# Patient Record
Sex: Female | Born: 2005 | Race: Black or African American | Hispanic: No | Marital: Single | Smoking: Never smoker
Health system: Southern US, Community
[De-identification: ages and names within clinical notes are randomized; demographics above are authoritative.]

---
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
## 2023-07-11 IMAGING — US US PELVIS COMPLETE
1 series · 13 of 25 positions shown · non-contrast
Comparison: None available.

CLINICAL DATA: Initial evaluation for acute pelvic pain.

EXAM:
TRANSABDOMINAL AND TRANSVAGINAL ULTRASOUND OF PELVIS
DOPPLER ULTRASOUND OF OVARIES
TECHNIQUE: Both transabdominal and transvaginal ultrasound examinations of the
pelvis were performed. Transabdominal technique was performed for
global imaging of the pelvis including uterus, ovaries, adnexal
regions, and pelvic cul-de-sac.
It was necessary to proceed with endovaginal exam following the
transabdominal exam to visualize the uterus, endometrium, and
ovaries. Color and duplex Doppler ultrasound was utilized to
evaluate blood flow to the ovaries.

[Series 1: us pelvis (transabdominal only) · 73 acquisitions, 13 frames shown]
[im 1/73]
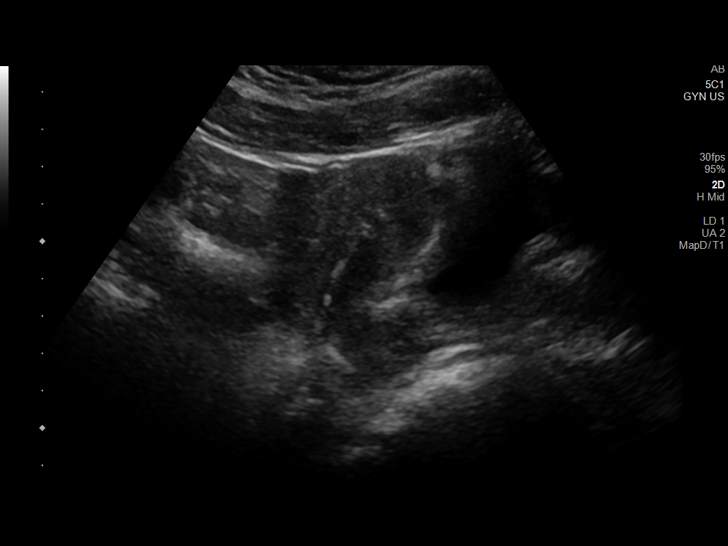
[im 7/73]
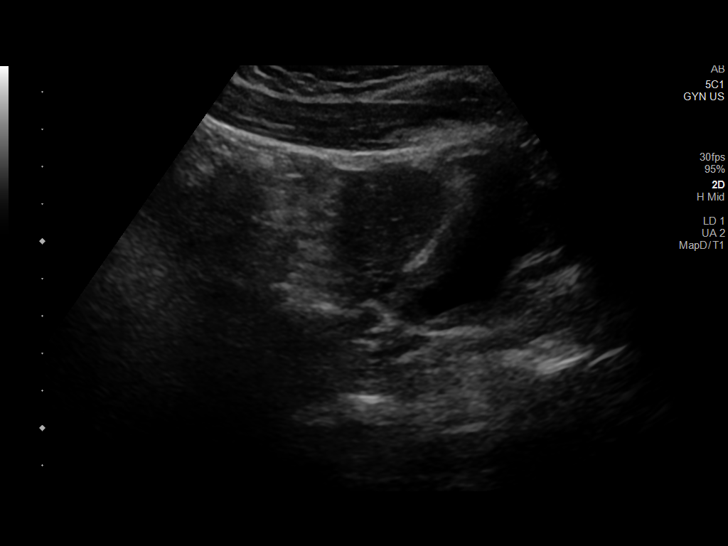
[im 13/73]
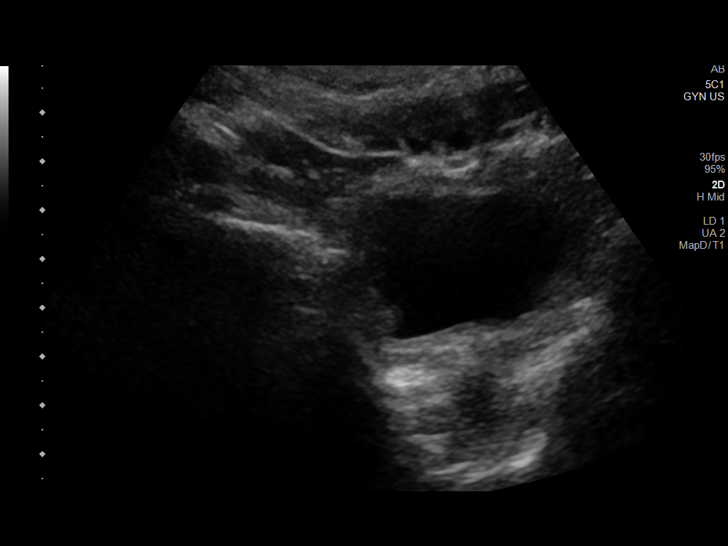
[im 19/73]
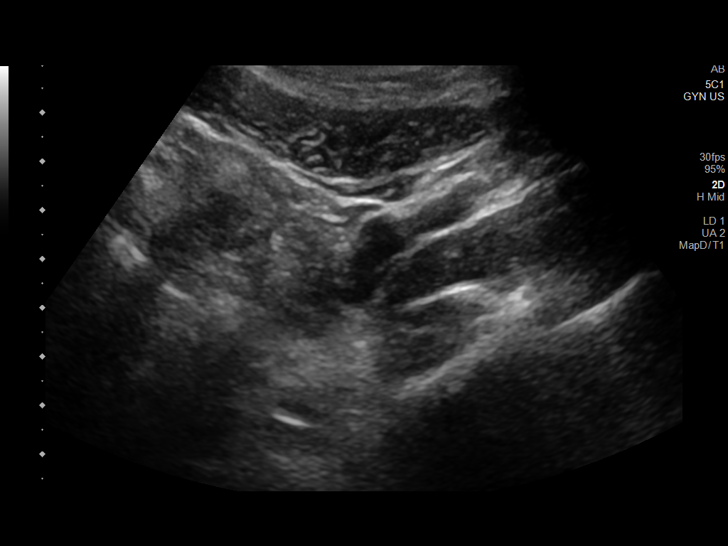
[im 25/73]
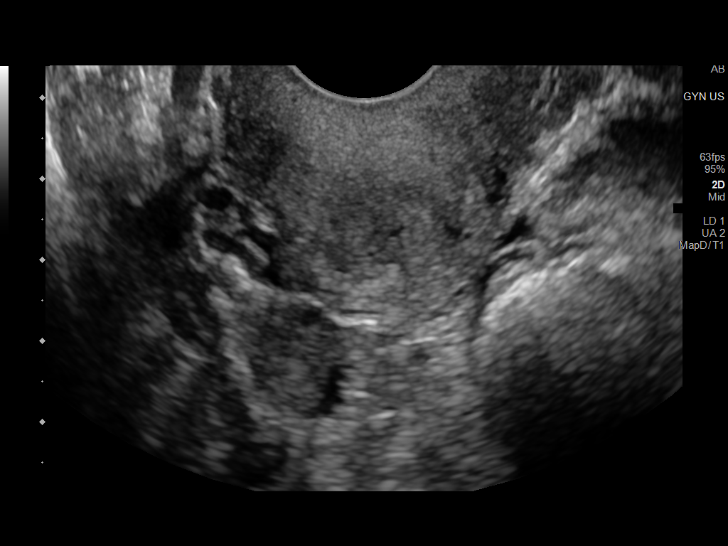
[im 31/73]
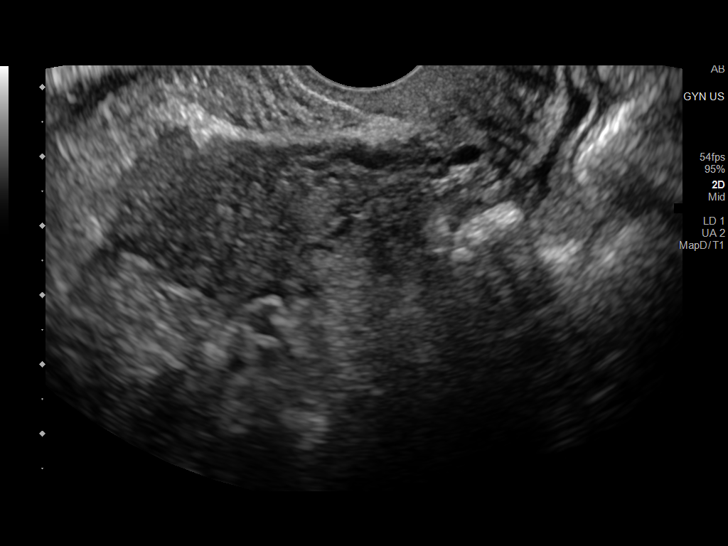
[im 37/73]
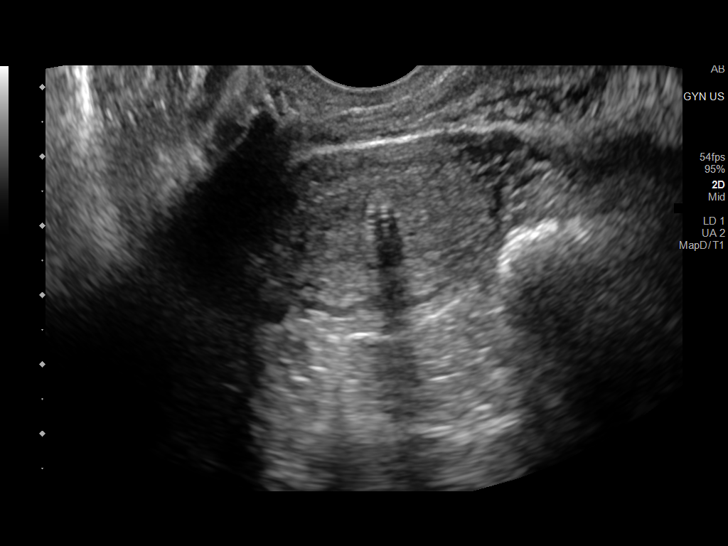
[im 43/73]
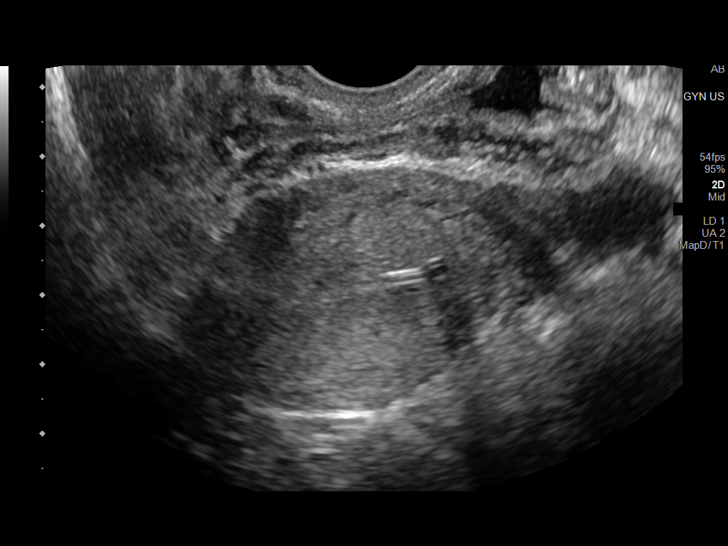
[im 49/73]
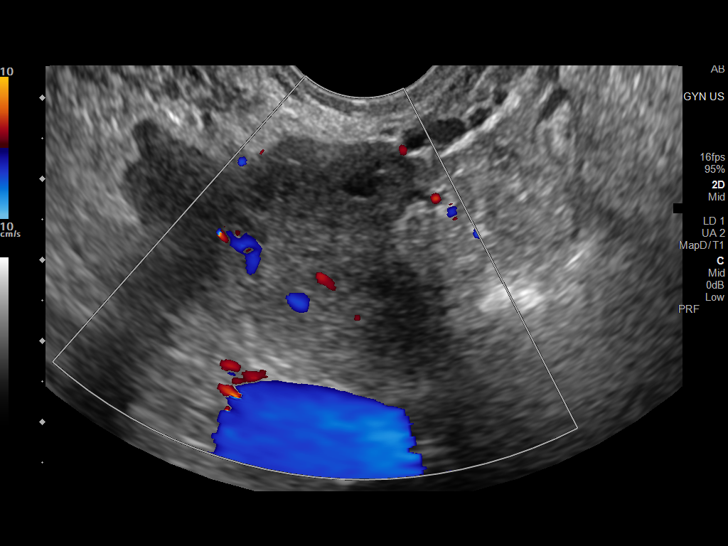
[im 55/73]
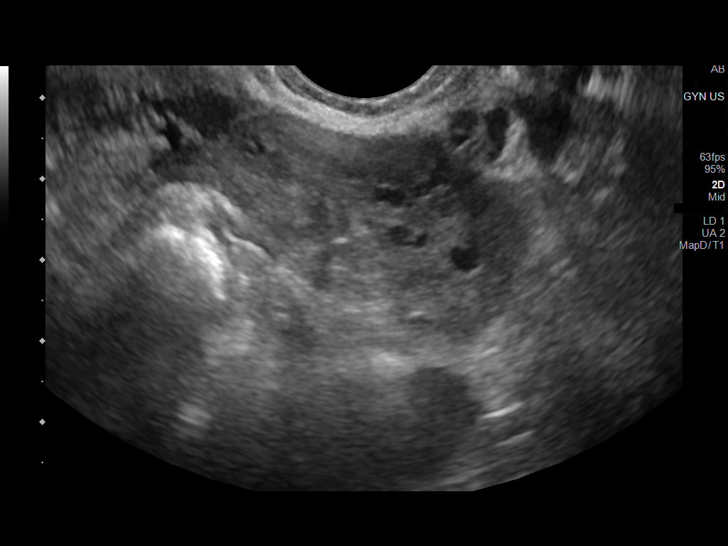
[im 61/73]
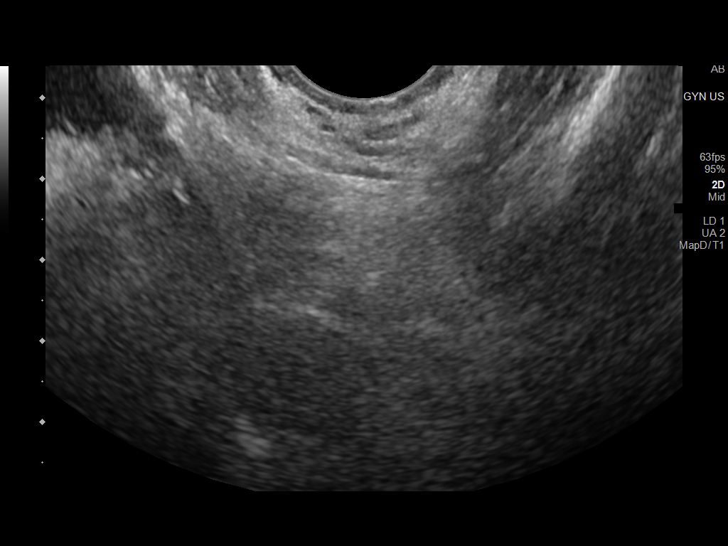
[im 67/73]
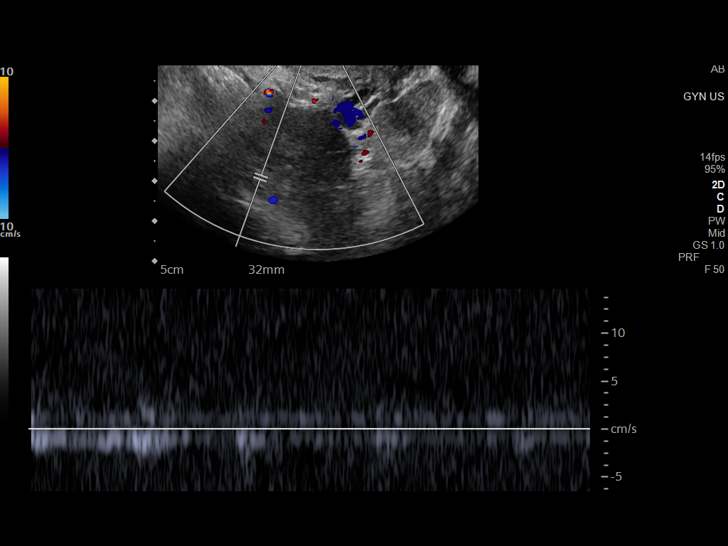
[im 73/73]
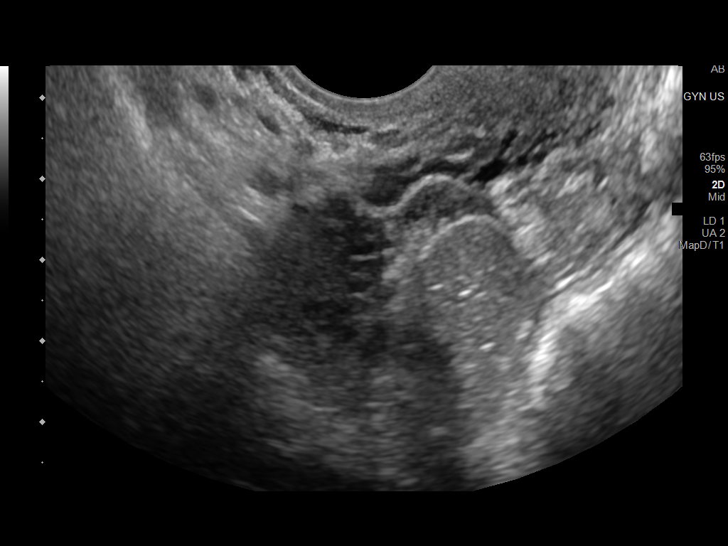

[13 of 25 positions shown; findings below may reference images not displayed]

FINDINGS: Uterus

Measurements: 7.3 x 3.4 x 4.0 cm = volume: 52.6 mL. Uterus is
anteverted. No discrete fibroid or other mass.

Endometrium

Thickness: 3.4 mm. No focal abnormality visualized. IUD in
appropriate position within the endometrial cavity at the level of
the mid uterine body.

Right ovary

Measurements: 2.9 x 2.9 x 3.7 cm = volume: 15.8 mL. Normal
appearance/no adnexal mass.

Left ovary

Measurements: 2.9 x 2.4 x 1.8 cm = volume: 6.4 mL. Normal
appearance/no adnexal mass.

Pulsed Doppler evaluation of both ovaries demonstrates normal
low-resistance arterial and venous waveforms.

Other findings

Small volume free fluid within the pelvis, presumably physiologic.
IMPRESSION: 1. Small volume free fluid within the pelvis, presumably
physiologic.
2. Otherwise unremarkable and normal pelvic ultrasound. No evidence
for torsion or other acute abnormality.
3. IUD in appropriate position within the endometrial cavity.

## 2023-11-20 ENCOUNTER — Emergency Department (HOSPITAL_BASED_OUTPATIENT_CLINIC_OR_DEPARTMENT_OTHER)
Admission: EM | Admit: 2023-11-20 | Discharge: 2023-11-20 | Disposition: A | Discharge status: Home / Self Care | Payer: Medicaid Other | Attending: Emergency Medicine | Admitting: Emergency Medicine

## 2023-11-20 ENCOUNTER — Other Ambulatory Visit: Payer: Self-pay

## 2023-11-20 DIAGNOSIS — Z20822 Contact with and (suspected) exposure to covid-19: Secondary | ICD-10-CM | POA: Insufficient documentation

## 2023-11-20 DIAGNOSIS — R059 Cough, unspecified: Secondary | ICD-10-CM | POA: Insufficient documentation

## 2023-11-20 DIAGNOSIS — R0981 Nasal congestion: Secondary | ICD-10-CM | POA: Insufficient documentation

## 2023-11-20 DIAGNOSIS — B974 Respiratory syncytial virus as the cause of diseases classified elsewhere: Secondary | ICD-10-CM | POA: Diagnosis not present

## 2023-11-20 DIAGNOSIS — B338 Other specified viral diseases: Secondary | ICD-10-CM

## 2023-11-20 LAB — RESP PANEL BY RT-PCR (RSV, FLU A&B, COVID)  RVPGX2
Influenza A by PCR: NEGATIVE
Influenza B by PCR: NEGATIVE
Resp Syncytial Virus by PCR: POSITIVE — AB
SARS Coronavirus 2 by RT PCR: NEGATIVE

## 2023-11-20 NOTE — ED Provider Notes (Signed)
   [place] [facility] AT [facility]  Provider Note  CSN: [id number] Arrival date & time: 11/20/23 2050  History Chief Complaint  Patient presents with   Nasal Congestion    Alexandra Jenkins. Callegari is a 18 y.o. female with no PMH reports 3 days of nasal congestion and cough. Other family members have had RSV recently.    Home Medications Prior to Admission medications   Medication Sig Start Date End Date Taking? Authorizing Provider  cephALEXin (KEFLEX) 500 MG capsule Take 1 capsule (500 mg total) by mouth 2 (two) times daily. 06/07/21   Roxy Horseman, PA-C  guaiFENesin (ROBITUSSIN) 100 MG/5ML liquid Take 10 mLs by mouth every 4 (four) hours as needed for cough or to loosen phlegm. 05/01/22   Carlisle Beers, FNP  ibuprofen 100 MG/5ML suspension Take 30 mLs (600 mg total) by mouth every 6 (six) hours as needed. 05/01/22   Carlisle Beers, FNP     Allergies    Patient has no known allergies.   Review of Systems   Review of Systems Please see HPI for pertinent positives and negatives  Physical Exam BP (!) 132/91   Pulse 96   Temp 98.8 F (37.1 C) (Oral)   Resp 17   Ht 5\' 2"  (1.575 m)   SpO2 100%   Physical Exam Vitals and nursing note reviewed.  HENT:     Head: Normocephalic.     Nose: Nose normal.  Eyes:     Extraocular Movements: Extraocular movements intact.  Pulmonary:     Effort: Pulmonary effort is normal.  Musculoskeletal:        General: Normal range of motion.     Cervical back: Neck supple.  Skin:    Findings: No rash (on exposed skin).  Neurological:     Mental Status: She is alert and oriented to person, place, and time.  Psychiatric:        Mood and Affect: Mood normal.     ED Results / Procedures / Treatments   EKG None  Procedures Procedures  Medications Ordered in the ED Medications - No data to display  Initial Impression and Plan  Patient here with mild viral URI symptoms, vitals are normal. RSV is  positive. Recommend supportive care at home. PCP follow up, RTED for any other concerns.    ED Course       MDM Rules/Calculators/A&P Medical Decision Making Problems Addressed: RSV infection: acute illness or injury  Amount and/or Complexity of Data Reviewed Labs: ordered. Decision-making details documented in ED Course.  Risk OTC drugs.     Final Clinical Impression(s) / ED Diagnoses Final diagnoses:  RSV infection    Rx / DC Orders ED Discharge Orders     None        Pollyann Savoy, MD 11/20/23 2327

## 2023-11-20 NOTE — ED Triage Notes (Signed)
Seeking RSV testing- family has same- cough, congestion.

## 2024-06-28 ENCOUNTER — Encounter (HOSPITAL_BASED_OUTPATIENT_CLINIC_OR_DEPARTMENT_OTHER): Payer: Self-pay | Admitting: Emergency Medicine

## 2024-06-28 ENCOUNTER — Other Ambulatory Visit: Payer: Self-pay

## 2024-06-28 ENCOUNTER — Emergency Department (HOSPITAL_BASED_OUTPATIENT_CLINIC_OR_DEPARTMENT_OTHER)
Admission: EM | Admit: 2024-06-28 | Discharge: 2024-06-28 | Disposition: A | Discharge status: Home / Self Care | Attending: Emergency Medicine | Admitting: Emergency Medicine

## 2024-06-28 DIAGNOSIS — M545 Low back pain, unspecified: Secondary | ICD-10-CM | POA: Insufficient documentation

## 2024-06-28 DIAGNOSIS — R519 Headache, unspecified: Secondary | ICD-10-CM | POA: Diagnosis present

## 2024-06-28 DIAGNOSIS — G44209 Tension-type headache, unspecified, not intractable: Secondary | ICD-10-CM | POA: Insufficient documentation

## 2024-06-28 LAB — URINALYSIS, ROUTINE W REFLEX MICROSCOPIC
Bilirubin Urine: NEGATIVE
Glucose, UA: NEGATIVE mg/dL
Hgb urine dipstick: NEGATIVE
Ketones, ur: NEGATIVE mg/dL
Leukocytes,Ua: NEGATIVE
Nitrite: NEGATIVE
Protein, ur: NEGATIVE mg/dL
Specific Gravity, Urine: 1.024 (ref 1.005–1.030)
pH: 7 (ref 5.0–8.0)

## 2024-06-28 LAB — CBC WITH DIFFERENTIAL/PLATELET
Abs Immature Granulocytes: 0.01 K/uL (ref 0.00–0.07)
Basophils Absolute: 0 K/uL (ref 0.0–0.1)
Basophils Relative: 0 %
Eosinophils Absolute: 0.1 K/uL (ref 0.0–0.5)
Eosinophils Relative: 1 %
HCT: 30.5 % — ABNORMAL LOW (ref 36.0–46.0)
Hemoglobin: 10.9 g/dL — ABNORMAL LOW (ref 12.0–15.0)
Immature Granulocytes: 0 %
Lymphocytes Relative: 53 %
Lymphs Abs: 3.7 K/uL (ref 0.7–4.0)
MCH: 27.2 pg (ref 26.0–34.0)
MCHC: 35.7 g/dL (ref 30.0–36.0)
MCV: 76.1 fL — ABNORMAL LOW (ref 80.0–100.0)
Monocytes Absolute: 0.4 K/uL (ref 0.1–1.0)
Monocytes Relative: 6 %
Neutro Abs: 2.8 K/uL (ref 1.7–7.7)
Neutrophils Relative %: 40 %
Platelets: 265 K/uL (ref 150–400)
RBC: 4.01 MIL/uL (ref 3.87–5.11)
RDW: 13.4 % (ref 11.5–15.5)
WBC: 7 K/uL (ref 4.0–10.5)
nRBC: 0 % (ref 0.0–0.2)

## 2024-06-28 LAB — COMPREHENSIVE METABOLIC PANEL WITH GFR
ALT: 22 U/L (ref 0–44)
AST: 17 U/L (ref 15–41)
Albumin: 4.1 g/dL (ref 3.5–5.0)
Alkaline Phosphatase: 61 U/L (ref 38–126)
Anion gap: 12 (ref 5–15)
BUN: 13 mg/dL (ref 6–20)
CO2: 22 mmol/L (ref 22–32)
Calcium: 9.4 mg/dL (ref 8.9–10.3)
Chloride: 106 mmol/L (ref 98–111)
Creatinine, Ser: 0.85 mg/dL (ref 0.44–1.00)
GFR, Estimated: >60 mL/min (ref >60)
Glucose, Bld: 102 mg/dL — ABNORMAL HIGH (ref 70–99)
Potassium: 3.7 mmol/L (ref 3.5–5.1)
Sodium: 139 mmol/L (ref 135–145)
Total Bilirubin: 0.3 mg/dL (ref 0.0–1.2)
Total Protein: 7.1 g/dL (ref 6.5–8.1)

## 2024-06-28 LAB — PREGNANCY, URINE: Preg Test, Ur: NEGATIVE

## 2024-06-28 MED ORDER — ACETAMINOPHEN 500 MG PO TABS
1000.0000 mg | ORAL_TABLET | Freq: Once | ORAL | Status: AC
  Administered 2024-06-28: 1000 mg via ORAL
  Filled 2024-06-28: qty 2

## 2024-06-28 MED ORDER — KETOROLAC TROMETHAMINE 15 MG/ML IJ SOLN
15.0000 mg | Freq: Once | INTRAMUSCULAR | Status: AC
  Administered 2024-06-28: 15 mg via INTRAMUSCULAR
  Filled 2024-06-28: qty 1

## 2024-06-28 NOTE — Discharge Instructions (Signed)
 Your back pain is most likely due to a muscular strain.  There is been a lot of research on back pain, unfortunately the only thing that seems to really help is Tylenol  and ibuprofen .  Relative rest is also important to not lift greater than 10 pounds bending or twisting at the waist.  Please follow-up with your family physician.  The other thing that really seems to benefit patients is physical therapy which your doctor may send you for.  Please return to the emergency department for new numbness or weakness to your arms or legs. Difficulty with urinating or urinating or pooping on yourself.  Also if you cannot feel toilet paper when you wipe or get a fever.   Take 4 over the counter ibuprofen  tablets 3 times a day or 2 over-the-counter naproxen tablets twice a day for pain. Also take tylenol  1000mg (2 extra strength) four times a day.   You can try stretches too, try [email or web address]

## 2024-06-28 NOTE — ED Provider Notes (Signed)
 [place] [facility] DEPART[facility] AT [facility] Provider Note   CSN: [id number] Arrival date & time: 06/28/24  8060     Patient presents with: Back Pain and Headache   Alexandra Jenkins. Alexandra Jenkins is a 18 y.o. female.   18 yo F with a chief complaint of headache and left-sided back pain.  The back pain actually started before the headache.  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  Seems to be a bit worse with certain movements.  Denies radiation.  Denies loss of bowel or bladder denies loss of perirectal sensation.  She tells me the headache is all over.  Worse with certain motions.  Has some visual changes at times.  Denies one-sided numbness or weakness or difficulty speech or swallowing.  Denies history of headaches.  She does feel like her neck is little bit stiff.   Back Pain Associated symptoms: headaches   Headache Associated symptoms: back pain        Prior to Admission medications   Medication Sig Start Date End Date Taking? Authorizing Provider  cephALEXin  (KEFLEX ) 500 MG capsule Take 1 capsule (500 mg total) by mouth 2 (two) times daily. 06/07/21   Alexandra Charleston, PA-C  guaiFENesin  (ROBITUSSIN) 100 MG/5ML liquid Take 10 mLs by mouth every 4 (four) hours as needed for cough or to loosen phlegm. 05/01/22   Alexandra Dorna HERO, FNP  ibuprofen  100 MG/5ML suspension Take 30 mLs (600 mg total) by mouth every 6 (six) hours as needed. 05/01/22   Alexandra Dorna HERO, FNP    Allergies: Patient has no known allergies.    Review of Systems  Musculoskeletal:  Positive for back pain.  Neurological:  Positive for headaches.    Updated Vital Signs BP (!) 128/91   Pulse 84   Temp 98.2 F (36.8 C) (Oral)   Resp 20   Ht 5' 2 (1.575 m)   Wt 97.5 kg   SpO2 99%   BMI 39.32 kg/m   Physical Exam Vitals and nursing note reviewed.  Constitutional:      General: She is not in acute distress.    Appearance: She is  well-developed. She is not diaphoretic.  HENT:     Head: Normocephalic and atraumatic.  Eyes:     Pupils: Pupils are equal, round, and reactive to light.  Cardiovascular:     Rate and Rhythm: Normal rate and regular rhythm.     Heart sounds: No murmur heard.    No friction rub. No gallop.  Pulmonary:     Effort: Pulmonary effort is normal.     Breath sounds: No wheezing or rales.  Abdominal:     General: There is no distension.     Palpations: Abdomen is soft.     Tenderness: There is no abdominal tenderness.  Musculoskeletal:        General: No tenderness.     Cervical back: Normal range of motion and neck supple.     Comments: Pulse motor and sensation intact to the left lower extremity.  Reflexes are 2+ and equal.  No clonus.  Perhaps mild left upper lumbar paraspinal musculature tenderness.  No obvious CVA tenderness.  No abdominal discomfort.  No midline spinal tenderness step-offs or deformities  Skin:    General: Skin is warm and dry.  Neurological:     Mental Status: She is alert and oriented to person, place, and time.     GCS: GCS eye subscore is  4. GCS verbal subscore is 5. GCS motor subscore is 6.     Cranial Nerves: Cranial nerves 2-12 are intact.     Sensory: Sensation is intact.     Motor: Motor function is intact.     Coordination: Coordination is intact.     Comments: Benign neurologic exam.  Ambulates without issue.  Psychiatric:        Behavior: Behavior normal.     (all labs ordered are listed, but only abnormal results are displayed) Labs Reviewed  CBC WITH DIFFERENTIAL/PLATELET - Abnormal; Notable for the following components:      Result Value   Hemoglobin 10.9 (*)    HCT 30.5 (*)    MCV 76.1 (*)    All other components within normal limits  COMPREHENSIVE METABOLIC PANEL WITH GFR - Abnormal; Notable for the following components:   Glucose, Bld 102 (*)    All other components within normal limits  URINALYSIS, ROUTINE W REFLEX MICROSCOPIC   PREGNANCY, URINE    EKG: None  Radiology: No results found.   Procedures   Medications Ordered in the ED  ketorolac  (TORADOL ) 15 MG/ML injection 15 mg (has no administration in time range)  acetaminophen  (TYLENOL ) tablet 1,000 mg (has no administration in time range)                                    Medical Decision Making Amount and/or Complexity of Data Reviewed Labs: ordered.  Risk OTC drugs. Prescription drug management.   18 yo F with 2 complaints.  Complaining of left-sided back pain and headache.  By history it sounds like musculoskeletal low back pain and a tension headache.  It sounds like she is just started doing hair, and has had some prolonged standing.  She has a benign neurologic exam.  Neurovascular exam of the lower extremity is unremarkable.  No trauma.  Will treat supportively.  PCP follow-up.  Patient did have lab work and a UA obtained due to the triage process.  No signs of infection, no hematuria pregnancy test is negative.  No significant electrolyte abnormalities.     Final diagnoses:  Tension headache  Acute left-sided low back pain without sciatica    ED Discharge Orders     None          Alexandra Share, DO 06/28/24 2219

## 2024-06-28 NOTE — ED Notes (Signed)
 Pt d/c instructions, medications, and follow-up care reviewed with pt. Pt verbalized understanding and had no further questions at time of d/c. Pt CA&Ox4, ambulatory, and in NAD at time of d/c

## 2024-06-28 NOTE — ED Triage Notes (Signed)
  Patient comes in with headache and mid-lower back pain that has been going on for 4 days.  Denies any injury.  Endorses some photosensitivity with headache.  Denies any N/V, or urinary symptoms. Taking ibuprofen  with last dose around 1400, 1000 mg.  Pain 10/10, sore/tender.

## 2024-07-03 ENCOUNTER — Other Ambulatory Visit: Payer: Self-pay

## 2024-07-03 ENCOUNTER — Emergency Department (HOSPITAL_COMMUNITY): Admission: EM | Admit: 2024-07-03 | Discharge: 2024-07-03 | Discharge status: Against Medical Advice

## 2024-07-03 ENCOUNTER — Encounter (HOSPITAL_COMMUNITY): Payer: Self-pay

## 2024-07-03 DIAGNOSIS — R519 Headache, unspecified: Secondary | ICD-10-CM | POA: Diagnosis not present

## 2024-07-03 DIAGNOSIS — Z5321 Procedure and treatment not carried out due to patient leaving prior to being seen by health care provider: Secondary | ICD-10-CM | POA: Insufficient documentation

## 2024-07-03 DIAGNOSIS — M545 Low back pain, unspecified: Secondary | ICD-10-CM | POA: Insufficient documentation

## 2024-07-03 LAB — URINALYSIS, ROUTINE W REFLEX MICROSCOPIC
Bacteria, UA: NONE SEEN
Bilirubin Urine: NEGATIVE
Glucose, UA: NEGATIVE mg/dL
Hgb urine dipstick: NEGATIVE
Ketones, ur: NEGATIVE mg/dL
Nitrite: NEGATIVE
Protein, ur: NEGATIVE mg/dL
Specific Gravity, Urine: 1.026 (ref 1.005–1.030)
pH: 5 (ref 5.0–8.0)

## 2024-07-03 LAB — COMPREHENSIVE METABOLIC PANEL WITH GFR
ALT: 14 U/L (ref 0–44)
AST: 16 U/L (ref 15–41)
Albumin: 3.7 g/dL (ref 3.5–5.0)
Alkaline Phosphatase: 46 U/L (ref 38–126)
Anion gap: 10 (ref 5–15)
BUN: 12 mg/dL (ref 6–20)
CO2: 20 mmol/L — ABNORMAL LOW (ref 22–32)
Calcium: 9.2 mg/dL (ref 8.9–10.3)
Chloride: 106 mmol/L (ref 98–111)
Creatinine, Ser: 0.9 mg/dL (ref 0.44–1.00)
GFR, Estimated: >60 mL/min (ref >60)
Glucose, Bld: 105 mg/dL — ABNORMAL HIGH (ref 70–99)
Potassium: 3.9 mmol/L (ref 3.5–5.1)
Sodium: 136 mmol/L (ref 135–145)
Total Bilirubin: 0.4 mg/dL (ref 0.0–1.2)
Total Protein: 6.9 g/dL (ref 6.5–8.1)

## 2024-07-03 LAB — CBC
HCT: 32.5 % — ABNORMAL LOW (ref 36.0–46.0)
Hemoglobin: 11.2 g/dL — ABNORMAL LOW (ref 12.0–15.0)
MCH: 27.1 pg (ref 26.0–34.0)
MCHC: 34.5 g/dL (ref 30.0–36.0)
MCV: 78.7 fL — ABNORMAL LOW (ref 80.0–100.0)
Platelets: 273 K/uL (ref 150–400)
RBC: 4.13 MIL/uL (ref 3.87–5.11)
RDW: 13.6 % (ref 11.5–15.5)
WBC: 6 K/uL (ref 4.0–10.5)
nRBC: 0 % (ref 0.0–0.2)

## 2024-07-03 LAB — HCG, SERUM, QUALITATIVE: Preg, Serum: NEGATIVE

## 2024-07-03 NOTE — ED Triage Notes (Signed)
 Pt c.o headache and lower back pain x 2 weeks, pt seen at [facility] and told she had a strained muscle but wasn't given any rx for pain.

## 2024-07-03 NOTE — ED Notes (Signed)
 Patient has decided to leave the Emergency Department before a provider assessment. Patient encouraged to stay and receive care. I have recommended that the patient seek prompt follow up care and that they may return to the ED at any time. The patient or authorized caregiver did not sign an AMA form that was offered prior to leaving.  Patient removed from the Track Board and dispositioned as LWBS.

## 2024-07-04 NOTE — Progress Notes (Signed)
 Emergency Department (ED) Resource Nursing Documentation of LWBS/AMA Disposition  Encounter Date: 07/03/2024  LWBS: Well check completed, pt reports continued back pain. Sts she has an appt with her PCP on Thursday 9/11. She sts she is going to try to stick it out until Thursday. She was advised to seek tx sooner if need be. Understanding verbalized. No further resource needs at this time.

## 2024-07-11 ENCOUNTER — Other Ambulatory Visit: Payer: Self-pay | Admitting: Student

## 2024-07-11 DIAGNOSIS — H538 Other visual disturbances: Secondary | ICD-10-CM

## 2024-07-11 DIAGNOSIS — R519 Headache, unspecified: Secondary | ICD-10-CM

## 2024-07-12 ENCOUNTER — Encounter: Payer: Self-pay | Admitting: Student

## 2024-07-20 ENCOUNTER — Other Ambulatory Visit

## 2024-07-21 ENCOUNTER — Ambulatory Visit (INDEPENDENT_AMBULATORY_CARE_PROVIDER_SITE_OTHER): Admitting: Diagnostic Neuroimaging

## 2024-07-21 ENCOUNTER — Encounter: Payer: Self-pay | Admitting: Diagnostic Neuroimaging

## 2024-07-21 VITALS — BP 114/72 | HR 88 | Ht 62.0 in | Wt 235.6 lb

## 2024-07-21 DIAGNOSIS — H4710 Unspecified papilledema: Secondary | ICD-10-CM | POA: Diagnosis not present

## 2024-07-21 MED ORDER — ACETAZOLAMIDE ER 500 MG PO CP12: 500.0000 mg | ORAL_CAPSULE | Freq: Two times a day (BID) | ORAL | 3 refills | Status: AC

## 2024-07-21 NOTE — Progress Notes (Signed)
 [facility]  PATIENT: Alexandra Jenkins. Sites DOB: [birth year]  REFERRING CLINICIAN: Octavia Charlie Hamilton, MD HISTORY FROM: patient  REASON FOR VISIT: new consult   HISTORICAL  CHIEF COMPLAINT:  Chief Complaint  Patient presents with   New Patient (Initial Visit)    Pt in room 7. Alone. Paper papilledema, severe HA, intracranial pressure.    HISTORY OF PRESENT ILLNESS:   18 year old female here for evaluation of papilledema and headaches.  Currently September 2025 patient had onset of intense pounding headaches.  Starting to have some blurred vision in the left eye.  Has a swishing sound in her ears as well.  She went to urgent care and also eye doctor.  On 911/25 eye exam demonstrated severe bilateral papilledema.  She was started on acetazolamide  500mg  twice a day.  Since that time headaches have improved.  Still has some vision problems.   REVIEW OF SYSTEMS: Full 14 system review of systems performed and negative with exception of: As per HPI.  ALLERGIES: No Known Allergies  HOME MEDICATIONS: Outpatient Medications Prior to Visit  Medication Sig Dispense Refill   acetaZOLAMIDE  ER (DIAMOX ) 500 MG capsule Take 500 mg by mouth 2 (two) times daily.     cephALEXin  (KEFLEX ) 500 MG capsule Take 1 capsule (500 mg total) by mouth 2 (two) times daily. (Patient not taking: Reported on 07/21/2024) 14 capsule 0   guaiFENesin  (ROBITUSSIN) 100 MG/5ML liquid Take 10 mLs by mouth every 4 (four) hours as needed for cough or to loosen phlegm. (Patient not taking: Reported on 07/21/2024) 236 mL 1   ibuprofen  100 MG/5ML suspension Take 30 mLs (600 mg total) by mouth every 6 (six) hours as needed. 473 mL 1   No facility-administered medications prior to visit.    PAST MEDICAL HISTORY: History reviewed. No pertinent past medical history.  PAST SURGICAL HISTORY: History reviewed. No pertinent surgical history.  FAMILY HISTORY: Family History  Problem Relation Age of Onset    Kidney failure Father    Breast cancer Maternal Grandmother     SOCIAL HISTORY: Social History   Socioeconomic History   Marital status: Single    Spouse name: Not on file   Number of children: Not on file   Years of education: Not on file   Highest education level: Not on file  Occupational History   Not on file  Tobacco Use   Smoking status: Never   Smokeless tobacco: Never  Vaping Use   Vaping status: Former  Substance and Sexual Activity   Alcohol use: Never   Drug use: Never   Sexual activity: Not on file  Other Topics Concern   Not on file  Social History Narrative   Wears glasses    Social Drivers of Corporate investment banker Strain: Not on file  Food Insecurity: Not on file  Transportation Needs: Not on file  Physical Activity: Not on file  Stress: Not on file  Social Connections: Not on file  Intimate Partner Violence: Not on file     PHYSICAL EXAM  GENERAL EXAM/CONSTITUTIONAL: Vitals:  Vitals:   07/21/24 0828  BP: 114/72  Pulse: 88  Weight: 235 lb 9.6 oz (106.9 kg)  Height: 5' 2 (1.575 m)   Body mass index is 43.09 kg/m. Wt Readings from Last 3 Encounters:  07/21/24 235 lb 9.6 oz (106.9 kg)  07/03/24 210 lb (95.3 kg)  06/28/24 215 lb (97.5 kg)   Patient is in no distress; well developed, nourished and groomed; neck is  supple  CARDIOVASCULAR: Examination of carotid arteries is normal; no carotid bruits Regular rate and rhythm, no murmurs Examination of peripheral vascular system by observation and palpation is normal  EYES: Ophthalmoscopic exam of optic discs and posterior segments is normal; no papilledema or hemorrhages No results found.  MUSCULOSKELETAL: Gait, strength, tone, movements noted in Neurologic exam below  NEUROLOGIC: MENTAL STATUS:      No data to display         awake, alert, oriented to person, place and time recent and remote memory intact normal attention and concentration language fluent,  comprehension intact, naming intact fund of knowledge appropriate  CRANIAL NERVE:  2nd - BILATERAL PAPILLEDEMA 2nd, 3rd, 4th, 6th - pupils equal and reactive to light, visual fields full to confrontation, extraocular muscles intact, no nystagmus 5th - facial sensation symmetric 7th - facial strength symmetric 8th - hearing intact 9th - palate elevates symmetrically, uvula midline 11th - shoulder shrug symmetric 12th - tongue protrusion midline  MOTOR:  normal bulk and tone, full strength in the BUE, BLE  SENSORY:  normal and symmetric to light touch, temperature, vibration  COORDINATION:  finger-nose-finger, fine finger movements normal  REFLEXES:  deep tendon reflexes 1+ and symmetric  GAIT/STATION:  narrow based gait     DIAGNOSTIC DATA (LABS, IMAGING, TESTING) - I reviewed patient records, labs, notes, testing and imaging myself where available.  Lab Results  Component Value Date   WBC 6.0 07/03/2024   HGB 11.2 (L) 07/03/2024   HCT 32.5 (L) 07/03/2024   MCV 78.7 (L) 07/03/2024   PLT 273 07/03/2024      Component Value Date/Time   NA 136 07/03/2024 1753   K 3.9 07/03/2024 1753   CL 106 07/03/2024 1753   CO2 20 (L) 07/03/2024 1753   GLUCOSE 105 (H) 07/03/2024 1753   BUN 12 07/03/2024 1753   CREATININE 0.90 07/03/2024 1753   CALCIUM 9.2 07/03/2024 1753   PROT 6.9 07/03/2024 1753   ALBUMIN 3.7 07/03/2024 1753   AST 16 07/03/2024 1753   ALT 14 07/03/2024 1753   ALKPHOS 46 07/03/2024 1753   BILITOT 0.4 07/03/2024 1753   GFRNONAA >60 07/03/2024 1753   No results found for: CHOL, HDL, LDLCALC, LDLDIRECT, TRIG, CHOLHDL No results found for: YHAJ8R No results found for: VITAMINB12 No results found for: TSH     ASSESSMENT AND PLAN  18 y.o. year old female here with:   Dx:  1. Papilledema     PLAN:  PAPILLEDEMA, HEADACHES, TINNITUS, BLURRED VISION since early Sept 2025 --> (in setting of ~40lb weight gain over 1 year; suspected  idiopathic intracranial hypertension (pseudotumor cerebri)) - check urgent MRI brain, MRV head (rule out other causes) - continue acetazolamide  500mg  twice a day; may need to increase to 1000mg  twice a day in future - follow up with PCP re: weight mgmt evaluation and treatment  Orders Placed This Encounter  Procedures   MR MRV HEAD WO CM   MR BRAIN W WO CONTRAST   Meds ordered this encounter  Medications   acetaZOLAMIDE  ER (DIAMOX ) 500 MG capsule    Sig: Take 1 capsule (500 mg total) by mouth 2 (two) times daily.    Dispense:  180 capsule    Refill:  3   Return in about 1 month (around 08/20/2024) for MyChart visit (15 min).  I reviewed labs, notes, records myself. I summarized findings and reviewed with patient, for this high risk condition (papilledema, suspected increased intracranial pressure) requiring high complexity decision  making.    EDUARD FABIENE HANLON, MD 07/21/2024, 9:42 AM Certified in Neurology, Neurophysiology and Neuroimaging  [facility] [address] [place], [place] [address] [phone]

## 2024-07-24 ENCOUNTER — Encounter: Payer: Self-pay | Admitting: Diagnostic Neuroimaging

## 2024-07-24 ENCOUNTER — Telehealth: Payer: Self-pay | Admitting: Diagnostic Neuroimaging

## 2024-07-24 NOTE — Telephone Encounter (Signed)
 MRI brain [facility] auth: [id number] exp. 07/24/24-08/23/24  MRA head [facility] shara: [id number] exp. 07/24/24-08/23/24 sent to GI [phone]

## 2024-07-31 ENCOUNTER — Ambulatory Visit
Admission: RE | Admit: 2024-07-31 | Discharge: 2024-07-31 | Disposition: A | Source: Ambulatory Visit | Attending: Diagnostic Neuroimaging | Admitting: Diagnostic Neuroimaging

## 2024-07-31 ENCOUNTER — Ambulatory Visit: Payer: Self-pay | Admitting: Diagnostic Neuroimaging

## 2024-07-31 DIAGNOSIS — H4710 Unspecified papilledema: Secondary | ICD-10-CM

## 2024-07-31 DIAGNOSIS — G932 Benign intracranial hypertension: Secondary | ICD-10-CM

## 2024-07-31 MED ORDER — GADOPICLENOL 0.5 MMOL/ML IV SOLN
10.0000 mL | Freq: Once | INTRAVENOUS | Status: AC | PRN
Start: 2024-07-31 — End: 2024-07-31
  Administered 2024-07-31: 10 mL via INTRAVENOUS

## 2024-08-08 NOTE — Discharge Instructions (Signed)
 Lumbar Puncture Discharge Instructions  You may resume normal activities; however, do not exert yourself strongly or do any heavy lifting today and tomorrow.   DO NOT drive today.    You may resume your normal diet and medications unless otherwise indicated. Drink a lot of extra fluids today and tomorrow.   The incidence of a spinal headache (headache, nausea and/or vomiting is about 5% (one in 20 patients).  If you develop a headache when you are sitting up or standing that gets better when you lie down, please lie flat for 24 hours and drink plenty of fluids until the headache goes away.  Caffeinated beverages may be helpful.   If you develop severe nausea and vomiting or a headache that does not go away with the flat bedrest after 48 hours, please call [phone].   Call your physician for a follow-up appointment.  The results of your myelogram will be sent directly to your physician by the following day.  Please call us  at [phone] if you have any questions or if complications develop after you arrive home.     Thank you for visiting [facility] today!

## 2024-08-09 ENCOUNTER — Ambulatory Visit
Admission: RE | Admit: 2024-08-09 | Discharge: 2024-08-09 | Disposition: A | Discharge status: Home / Self Care | Source: Ambulatory Visit | Attending: Diagnostic Neuroimaging | Admitting: Diagnostic Neuroimaging

## 2024-08-09 DIAGNOSIS — G932 Benign intracranial hypertension: Secondary | ICD-10-CM

## 2024-08-11 ENCOUNTER — Ambulatory Visit: Payer: Self-pay | Admitting: Diagnostic Neuroimaging

## 2024-08-13 LAB — CSF CELL COUNT WITH DIFFERENTIAL
RBC Count, CSF: 4100 {cells}/uL — ABNORMAL HIGH
TOTAL NUCLEATED CELL: 0 {cells}/uL (ref 0–5)

## 2024-08-13 LAB — PROTEIN, CSF: Total Protein, CSF: 45 mg/dL (ref 15–45)

## 2024-08-13 LAB — CSF CULTURE W GRAM STAIN
MICRO NUMBER:: 17102320
Result:: NO GROWTH
SPECIMEN QUALITY:: ADEQUATE

## 2024-08-13 LAB — GLUCOSE, CSF: Glucose, CSF: 64 mg/dL (ref 40–80)

## 2024-08-15 ENCOUNTER — Telehealth: Admitting: Diagnostic Neuroimaging

## 2024-08-15 ENCOUNTER — Encounter: Payer: Self-pay | Admitting: Diagnostic Neuroimaging

## 2024-08-15 DIAGNOSIS — G932 Benign intracranial hypertension: Secondary | ICD-10-CM | POA: Diagnosis not present

## 2024-08-15 NOTE — Progress Notes (Signed)
 [facility]  PATIENT: Alexandra Jenkins. Vaness DOB: Jun 22, 2006  REFERRING CLINICIAN: Desiree Quale, NP HISTORY FROM: patient  REASON FOR VISIT: follow up   HISTORICAL  CHIEF COMPLAINT:  No chief complaint on file.   HISTORY OF PRESENT ILLNESS:   UPDATE (08/15/24, VRP): Since last visit, doing better. No more HA, vision or tinnitus issues. Tolerating meds.   PRIOR HPI (07/21/24, VRP): 18 year old female here for evaluation of papilledema and headaches.  Currently September 2025 patient had onset of intense pounding headaches.  Starting to have some blurred vision in the left eye.  Has a swishing sound in her ears as well.  She went to urgent care and also eye doctor.  On 911/25 eye exam demonstrated severe bilateral papilledema.  She was started on acetazolamide  500mg  twice a day.  Since that time headaches have improved.  Still has some vision problems.   REVIEW OF SYSTEMS: Full 14 system review of systems performed and negative with exception of: As per HPI.  ALLERGIES: No Known Allergies  HOME MEDICATIONS: Outpatient Medications Prior to Visit  Medication Sig Dispense Refill   acetaZOLAMIDE  ER (DIAMOX ) 500 MG capsule Take 1 capsule (500 mg total) by mouth 2 (two) times daily. 180 capsule 3   No facility-administered medications prior to visit.    PAST MEDICAL HISTORY: No past medical history on file.  PAST SURGICAL HISTORY: No past surgical history on file.  FAMILY HISTORY: Family History  Problem Relation Age of Onset   Kidney failure Father    Breast cancer Maternal Grandmother     SOCIAL HISTORY: Social History   Socioeconomic History   Marital status: Single    Spouse name: Not on file   Number of children: Not on file   Years of education: Not on file   Highest education level: Not on file  Occupational History   Not on file  Tobacco Use   Smoking status: Never   Smokeless tobacco: Never  Vaping Use   Vaping status: Former   Substance and Sexual Activity   Alcohol use: Never   Drug use: Never   Sexual activity: Not on file  Other Topics Concern   Not on file  Social History Narrative   Wears glasses    Social Drivers of Health   Financial Resource Strain: Not on file  Food Insecurity: Not on file  Transportation Needs: Not on file  Physical Activity: Not on file  Stress: Not on file  Social Connections: Not on file  Intimate Partner Violence: Not on file     PHYSICAL EXAM  GENERAL EXAM/CONSTITUTIONAL: Vitals:  There were no vitals filed for this visit.  There is no height or weight on file to calculate BMI. Wt Readings from Last 3 Encounters:  07/21/24 235 lb 9.6 oz (106.9 kg)  07/03/24 210 lb (95.3 kg)  06/28/24 215 lb (97.5 kg)   Patient is in no distress; well developed, nourished and groomed; neck is supple  CARDIOVASCULAR: Examination of carotid arteries is normal; no carotid bruits Regular rate and rhythm, no murmurs Examination of peripheral vascular system by observation and palpation is normal  EYES: Ophthalmoscopic exam of optic discs and posterior segments is normal; no papilledema or hemorrhages No results found.  MUSCULOSKELETAL: Gait, strength, tone, movements noted in Neurologic exam below  NEUROLOGIC: MENTAL STATUS:      No data to display         awake, alert, oriented to person, place and time recent and remote memory intact normal  attention and concentration language fluent, comprehension intact, naming intact fund of knowledge appropriate  CRANIAL NERVE:  2nd - BILATERAL PAPILLEDEMA 2nd, 3rd, 4th, 6th - pupils equal and reactive to light, visual fields full to confrontation, extraocular muscles intact, no nystagmus 5th - facial sensation symmetric 7th - facial strength symmetric 8th - hearing intact 9th - palate elevates symmetrically, uvula midline 11th - shoulder shrug symmetric 12th - tongue protrusion midline  MOTOR:  normal bulk and tone,  full strength in the BUE, BLE  SENSORY:  normal and symmetric to light touch, temperature, vibration  COORDINATION:  finger-nose-finger, fine finger movements normal  REFLEXES:  deep tendon reflexes 1+ and symmetric  GAIT/STATION:  narrow based gait     DIAGNOSTIC DATA (LABS, IMAGING, TESTING) - I reviewed patient records, labs, notes, testing and imaging myself where available.  Lab Results  Component Value Date   WBC 6.0 07/03/2024   HGB 11.2 (L) 07/03/2024   HCT 32.5 (L) 07/03/2024   MCV 78.7 (L) 07/03/2024   PLT 273 07/03/2024      Component Value Date/Time   NA 136 07/03/2024 1753   K 3.9 07/03/2024 1753   CL 106 07/03/2024 1753   CO2 20 (L) 07/03/2024 1753   GLUCOSE 105 (H) 07/03/2024 1753   BUN 12 07/03/2024 1753   CREATININE 0.90 07/03/2024 1753   CALCIUM 9.2 07/03/2024 1753   PROT 6.9 07/03/2024 1753   ALBUMIN 3.7 07/03/2024 1753   AST 16 07/03/2024 1753   ALT 14 07/03/2024 1753   ALKPHOS 46 07/03/2024 1753   BILITOT 0.4 07/03/2024 1753   GFRNONAA >60 07/03/2024 1753   No results found for: CHOL, HDL, LDLCALC, LDLDIRECT, TRIG, CHOLHDL No results found for: YHAJ8R No results found for: VITAMINB12 No results found for: TSH   MRI brain / MRV head  1. Protrusion of the optic nerve heads into the posterior globes. Associated enhancement of the right optic nerve head. Narrowed appearance of the distal transverse dural venous sinuses bilaterally. These findings can be seen in the setting of idiopathic intracranial hypertension (pseudotumor cerebri). 2. 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 No further imaging evaluation is necessary. This follows ACR consensus guidelines: Management of Incidental Pituitary Findings on CT, MRI and F18-FDG PET: A White Paper of the ACR Incidental Findings Committee. J Am Coll Radiol 2018; 15: 966-72. 3.  Otherwise unremarkable MRI appearance of the brain. 4. No evidence of dural venous sinus thrombosis.  08/09/24 LP 1. Positive for increased opening pressure of 33 cm H2O. 2. Successful L2-L3 lumbar puncture with removal of 27.5 mL CSF. 3. Normal closing pressure of 15 cm H2O.    ASSESSMENT AND PLAN  18 y.o. year old female here with:   Dx:  1. IIH (idiopathic intracranial hypertension)      PLAN:  idiopathic intracranial hypertension (pseudotumor cerebri) --> improving --> PAPILLEDEMA, HEADACHES, TINNITUS, BLURRED VISION since early Sept 2025 --> (in setting of ~40lb weight gain over 1 year) - continue acetazolamide  500mg  twice a day - follow up with Dr Octavia for serial eye exams - follow up with PCP re: weight mgmt evaluation and treatment  Return in about 3 months (around 11/15/2024) for MyChart visit (15 min).  Virtual Visit via Video Note  I connected with Alexandra Jenkins on 08/15/2024 at  3:00 PM EDT by a video enabled telemedicine application and verified that I am speaking with the correct  person using two identifiers.   I discussed the limitations of evaluation and management by telemedicine and the availability of in person appointments. The patient expressed understanding and agreed to proceed.  Patient is at home and I am at the office.   I spent 15 minutes of face-to-face and non-face-to-face time with patient.  This included previsit chart review, lab review, study review, order entry, electronic health record documentation, patient education.      EDUARD FABIENE HANLON, MD 08/15/2024, 3:22 PM Certified in Neurology, Neurophysiology and Neuroimaging  [facility] [address] [place], [place] [address] [phone]

## 2024-09-05 ENCOUNTER — Ambulatory Visit (INDEPENDENT_AMBULATORY_CARE_PROVIDER_SITE_OTHER): Admitting: Neurosurgery

## 2024-09-05 ENCOUNTER — Encounter: Payer: Self-pay | Admitting: Neurosurgery

## 2024-09-05 VITALS — BP 108/75 | HR 78 | Temp 97.8°F | Ht 62.0 in | Wt 236.0 lb

## 2024-09-05 DIAGNOSIS — G932 Benign intracranial hypertension: Secondary | ICD-10-CM | POA: Diagnosis not present

## 2024-09-05 NOTE — Progress Notes (Unsigned)
 Assessment : Discussed the use of AI scribe software for clinical note transcription with the patient, who gave verbal consent to proceed.  History of Present Illness Alexandra Jenkins is a 18 year old female who presents with persistent eye symptoms despite treatment. She was referred by Dr. Sylvie for evaluation of persistent eye symptoms.  Two months ago, she experienced a severe, unbearable headache that was unresponsive to over-the-counter medications like ibuprofen  and Advil . Initially diagnosed as a tension headache at urgent care, the headache persisted, leading her to consult her primary care physician, who planned an MRI, but insurance issues delayed it.  During a routine eye check-up with Dr. Omelia, imaging revealed swelling behind her eyes. She was prescribed acetazolamide  500 mg twice daily. Initially, the medication did not alleviate her symptoms, but after a few days, it began to work, reducing her headache severity.  A month ago, she was evaluated by a neurologist who scheduled a lumbar puncture. The procedure revealed a pressure of 33 cm H2O, and approximately 20 mL of cerebrospinal fluid was removed, reducing the pressure to 15 cm H2O. Post-procedure, her headaches resolved, and she reports feeling back to normal.  During the period of headaches, she experienced blurry vision, particularly with distant objects, but her vision has since returned to normal. No current side effects from the medication and no changes in her vision.    Plan : I am very happy to hear that her headaches have completely resolved and she feels good. She says that her vision is back to normal as well. The Diamox  dose was raised to 500 mg 3 times a day and I think that that is appropriate given her eye exam.  I went over the causes of idiopathic intracranial hypertension and emphasized the role of diet change and exercise.  She is going to go and talk to her primary care doctor about getting on a weight  loss regimen.  We talked about her medication and unfortunately 500 mg Diamox  is not available in the tablet form so she will continue to take the capsules.  I explained to her that sometimes the eye exam can lag the ICP reduction that Diamox  provides and I would like to see her back in about 4 to 6 weeks.  At that time we will decide what to do next.  I explicitly told her that if she starts having more visual problems to call me and if it is an acute decline, to go to the emergency room immediately.  At this moment I will hold off on getting MRV or additional imaging pursuant of the weight loss regimen and watching her clinical exam and eye exam closely.   Social History   Socioeconomic History   Marital status: Single    Spouse name: Not on file   Number of children: Not on file   Years of education: Not on file   Highest education level: Not on file  Occupational History   Not on file  Tobacco Use   Smoking status: Never   Smokeless tobacco: Never  Vaping Use   Vaping status: Former  Substance and Sexual Activity   Alcohol use: Never   Drug use: Never   Sexual activity: Not on file  Other Topics Concern   Not on file  Social History Narrative   Wears glasses    Social Drivers of Health   Financial Resource Strain: Not on file  Food Insecurity: Not on file  Transportation Needs: Not on file  Physical  Activity: Not on file  Stress: Not on file  Social Connections: Not on file  Intimate Partner Violence: Not on file    Family History  Problem Relation Age of Onset   Kidney failure Father    Breast cancer Maternal Grandmother     No Known Allergies  History reviewed. No pertinent past medical history.  History reviewed. No pertinent surgical history.   Physical Exam   Physical Exam HENT:     Head: Normocephalic.     Nose: Nose normal.  Eyes:     Pupils: Pupils are equal, round, and reactive to light.  Cardiovascular:     Rate and Rhythm: Normal rate.   Pulmonary:     Effort: Pulmonary effort is normal.  Abdominal:     General: Abdomen is flat.  Musculoskeletal:     Cervical back: Normal range of motion.  Neurological:     Mental Status: She is alert.     Cranial Nerves: Cranial nerves 2-12 are intact.     Sensory: Sensation is intact.     Motor: Motor function is intact.     Coordination: Coordination is intact.     Results for orders placed or performed during the hospital encounter of 07/31/24  MR BRAIN W WO CONTRAST   Narrative   CLINICAL DATA:  Provided history: Papilledema, IIH. Additional history provided by the scanning technologist: Vision changes, hearing loss.  EXAM: MRI HEAD WITHOUT AND WITH CONTRAST  MRV HEAD WITHOUT CONTRAST  TECHNIQUE: Multiplanar, multi-echo pulse sequences of the brain and surrounding structures were acquired without intravenous contrast. Angiographic images of the intracranial venous structures were acquired using MRV technique without intravenous contrast.  COMPARISON:  10 mL opened via  FINDINGS: MRI HEAD FINDINGS  Brain:  Cerebral volume is normal.  10 mm focus of pre-contrast T1 hyperintense signal within the posterior aspect of the sella, more prominent than expected from a posterior pituitary bright spot alone. Pre-contrast T1 hyperintense signal limits evaluation for enhancement at this site. 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 No significant mass effect upon, or invasion of, regional structures.  No cortical encephalomalacia is identified. No significant cerebral white matter disease.  There is no acute infarct.  No evidence of an intracranial mass.  No chronic intracranial blood products.  No extra-axial fluid collection.  No midline shift.  Vascular: Maintained flow voids within the proximal large arterial vessels.  Skull  and upper cervical spine: No focal worrisome marrow lesion.  Sinuses/Orbits: Protrusion of the optic nerve heads into the posterior globes (for instance as seen on series 15, image 9). Associated optic nerve head enhancement on the right (series 19, image 60). Mild mucosal thickening with a single left ethmoid air cell.  MR VENOGRAM HEAD FINDINGS  The superior sagittal sinus, internal cerebral veins, vein of Galen, straight sinus, transverse sinuses, sigmoid sinuses and visualized jugular veins are patent. There is no appreciable intracranial venous thrombosis. Narrowed appearance of the distal transverse dural venous sinuses bilaterally (for instance as seen on series 20, images 2 and 8).  IMPRESSION: 1. Protrusion of the optic nerve heads into the posterior globes. Associated enhancement of the right optic nerve head. Narrowed appearance of the distal transverse dural venous sinuses bilaterally. These findings can be seen in the setting of idiopathic intracranial hypertension (pseudotumor cerebri). 2. 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 No further imaging evaluation is necessary. This follows ACR consensus guidelines: Management of Incidental Pituitary Findings on CT, MRI and F18-FDG PET: A White Paper of the ACR Incidental Findings Committee. J Am Coll Radiol 2018; 15: 966-72. 3. Otherwise unremarkable MRI appearance of the brain. 4. No evidence of dural venous sinus thrombosis.   Electronically Signed   By: Rockey Childs D.O.   On: 07/31/2024 18:30

## 2024-10-03 ENCOUNTER — Ambulatory Visit: Admitting: Neurosurgery

## 2024-10-06 ENCOUNTER — Telehealth: Payer: Self-pay | Admitting: Neurosurgery

## 2024-10-06 ENCOUNTER — Ambulatory Visit: Admitting: Neurosurgery

## 2024-10-06 NOTE — Telephone Encounter (Signed)
 Returned call to patient and rescheduled appt

## 2024-10-06 NOTE — Telephone Encounter (Signed)
 Patient needs to reschedule appt or just cancel because she can not get off today.

## 2024-11-03 ENCOUNTER — Ambulatory Visit: Admitting: Neurosurgery
